# Patient Record
Sex: Male | Born: 1960 | Marital: Married | Smoking: Never smoker
Health system: Southern US, Community
[De-identification: ages and names within clinical notes are randomized; demographics above are authoritative.]

## PROBLEM LIST (undated history)

## (undated) DIAGNOSIS — N400 Enlarged prostate without lower urinary tract symptoms: Secondary | ICD-10-CM

## (undated) DIAGNOSIS — N529 Male erectile dysfunction, unspecified: Secondary | ICD-10-CM

## (undated) DIAGNOSIS — Z8619 Personal history of other infectious and parasitic diseases: Secondary | ICD-10-CM

## (undated) HISTORY — PX: HERNIA REPAIR: SHX51

## (undated) HISTORY — DX: Male erectile dysfunction, unspecified: N52.9

## (undated) HISTORY — DX: Benign prostatic hyperplasia without lower urinary tract symptoms: N40.0

## (undated) HISTORY — DX: Personal history of other infectious and parasitic diseases: Z86.19

---

## 2015-05-26 ENCOUNTER — Encounter: Payer: Self-pay | Admitting: Family Medicine

## 2015-05-26 ENCOUNTER — Ambulatory Visit (INDEPENDENT_AMBULATORY_CARE_PROVIDER_SITE_OTHER): Payer: Managed Care, Other (non HMO) | Admitting: Family Medicine

## 2015-05-26 VITALS — BP 147/89 | HR 78 | Temp 98.0°F | Resp 20 | Ht 74.0 in | Wt 175.8 lb

## 2015-05-26 DIAGNOSIS — R0781 Pleurodynia: Secondary | ICD-10-CM | POA: Insufficient documentation

## 2015-05-26 DIAGNOSIS — Z7689 Persons encountering health services in other specified circumstances: Secondary | ICD-10-CM

## 2015-05-26 DIAGNOSIS — R0789 Other chest pain: Secondary | ICD-10-CM | POA: Insufficient documentation

## 2015-05-26 DIAGNOSIS — Z7189 Other specified counseling: Secondary | ICD-10-CM | POA: Diagnosis not present

## 2015-05-26 MED ORDER — BACLOFEN 10 MG PO TABS: 10.0000 mg | ORAL_TABLET | Freq: Three times a day (TID) | ORAL | Status: AC

## 2015-05-26 NOTE — Progress Notes (Signed)
Patient ID: Peter Clay Clay, male   DOB: [birth year], 55 y.o.   MRN: [id number]      Patient ID: Peter Clay Clay, male  DOB: [birth year], 55 y.o.   MRN: [id number]  Subjective:  Peter Clay Clay is a 55 y.o. male present for establishment of care and back pain. All past medical history, surgical history, allergies, family history, immunizations, medications and social history were obtained and entered  in the electronic medical record today. All recent labs, ED visits and hospitalizations within the last year were reviewed.  His full-time home is in [place]. He works in the area through the week. He plans to continue his health maintenance and yearly physicals through his primary care physician in [place]. He plans on using this office for acute illnesses only while in the area. Records requested  Back pain: Patient states he went golfing approximately 10 days ago with his friend and by, and will Morning with a spasm down the left side of his mid back. He points just below his left scapula as location. He states the pain is worse when he lays on his back or his right side. He was evaluated by the doctor on his work campus, the prescribed him Flexeril 5 mg and tramadol for pain. Patient states he was unable to take these secondary to sedation and dizziness. Patient states he has never had a back injury prior. He denies any other increase in activity, with the exception of golfing. He has never had any back surgeries. He denies any malignancies, fever, chills, night sweats, unintentional weight loss, dyspnea.   Health maintenance: All health maintenance will be addressed by [place] PCP  Past Medical History  Diagnosis Date  . History of chicken pox    No Known Allergies Past Surgical History  Procedure Laterality Date  . Hernia repair      inguinal (right)   Family History  Problem Relation Age of Onset  . Alcohol abuse Father   . Early death Father   . Mental illness Sister   . Arthritis  Maternal Grandmother   . Hearing loss Maternal Grandmother   . Hyperlipidemia Maternal Grandmother   . Heart disease Maternal Grandmother   . Hearing loss Maternal Grandfather   . Hyperlipidemia Maternal Grandfather   . Heart disease Maternal Grandfather   . Arthritis Paternal Grandmother   . Hearing loss Paternal Grandfather   . Alcohol abuse Paternal Grandfather    Social History   Social History  . Marital Status: Married    Spouse Name: N/A  . Number of Children: N/A  . Years of Education: N/A   Occupational History  . Not on file.   Social History Main Topics  . Smoking status: Never Smoker   . Smokeless tobacco: Never Used  . Alcohol Use: 1.2 - 1.8 oz/week    2-3 Cans of beer per week  . Drug Use: No  . Sexual Activity: Yes   Other Topics Concern  . Not on file   Social History Narrative   Married to Peter Clay Clay. One child name Peter Clay Clay.   Full-time home was in [place], he works in the area through the week. He plans to continue his routine annual exams through his doctor in [place].   MBA, full-time employed Theatre manager.   Drinks caffeine, does not use herbal remedies.   Wear seatbelt, wears bicycle helmet, exercise is greater than 3 times a week   Smoke detector at home, firearms in the home and in a locked cabinet.  Safe in his relationships.     ROS: Negative, with the exception of above mentioned in HPI  Objective: BP 147/89 mmHg  Pulse 78  Temp(Src) 98 F (36.7 C) (Oral)  Resp 20  Ht  (1.88 m)  Wt 175 lb 12 oz (79.72 kg)  BMI 22.56 kg/m2  SpO2 97% Gen: Afebrile. No acute distress. Nontoxic in appearance, well-developed, well-nourished, physically fit, Caucasian male. Pleasant HENT: AT. [place]. MMM, no oral lesions,  no Cough on exam, no hoarseness on exam. Eyes:Pupils Equal Round Reactive to light, Extraocular movements intact,  Conjunctiva without redness, discharge or icterus. Neck/lymp/endocrine: Supple, no lymphadenopathy CV:  RRR, no murmur, no edema Chest: CTAB, no wheeze, rhonchi or crackles. Normal Respiratory effort. Good Air movement. Abd: Soft. NTND. BS present.  Skin: No rashes, purpura or petechiae. No erythema, no bruising, skin intact. Neuro: Normal gait. PERLA. EOMi. Alert. Oriented x3.  MSK: Mild soft tissue swelling over ninth through 11th left lateral rib shaft, just inferior to scapula. Mild tenderness to palpation over this area. Full range of motion. Discomfort with side bending and rotation. Neurovascularly intact distally. Psych: Normal affect, dress and demeanor. Normal speech. Normal thought content and judgment.   Assessment/plan: [name] is a 55 y.o. male  present for establishment of care and  left lateral posterior rib pain. 1. Rib pain on left side - Discussed with patient possible differential diagnosis of rib fracture versus musculoskeletal strain. Patient has been unable to tolerate Flexeril and tramadol. Discussed baclofen use for less sedating properties, and Aleve twice a day for the next 5-7 days. Heat therapy, 20 minute intervals. - Patient to follow-up in one week, if he does not see improvement with the above regimen, he is to obtain future ordered x-ray the day prior to his appointment. - No lifting - baclofen (LIORESAL) 10 MG tablet; Take 1 tablet (10 mg total) by mouth 3 (three) times daily.  Dispense: 60 each; Refill: 0 - DG Ribs Unilateral Left; Future  Greater than 45 minutes was spent with patient, greater than 50% of that time was spent face-to-face with patient counseling and coordinating care.  No Follow-up on file.  Felix Pacini, DO  Primary [facility]- [place]

## 2015-05-26 NOTE — Patient Instructions (Signed)

## 2015-05-28 ENCOUNTER — Encounter: Payer: Self-pay | Admitting: Family Medicine

## 2015-05-28 DIAGNOSIS — Z7689 Persons encountering health services in other specified circumstances: Secondary | ICD-10-CM | POA: Insufficient documentation

## 2015-06-01 ENCOUNTER — Ambulatory Visit (HOSPITAL_BASED_OUTPATIENT_CLINIC_OR_DEPARTMENT_OTHER)
Admission: RE | Admit: 2015-06-01 | Discharge: 2015-06-01 | Disposition: A | Discharge status: Home / Self Care | Payer: Managed Care, Other (non HMO) | Source: Ambulatory Visit | Attending: Family Medicine | Admitting: Family Medicine

## 2015-06-01 ENCOUNTER — Encounter: Payer: Self-pay | Admitting: Family Medicine

## 2015-06-01 DIAGNOSIS — R0781 Pleurodynia: Secondary | ICD-10-CM | POA: Insufficient documentation

## 2016-05-07 IMAGING — CR DG RIBS 2V*L*
2 series · 2 of 2 positions shown · non-contrast
Comparison: None.

CLINICAL DATA: LEFT lower rib pain for 10 days.

EXAM:
LEFT RIBS - 2 VIEW

[w ribs ap/pa upper left]
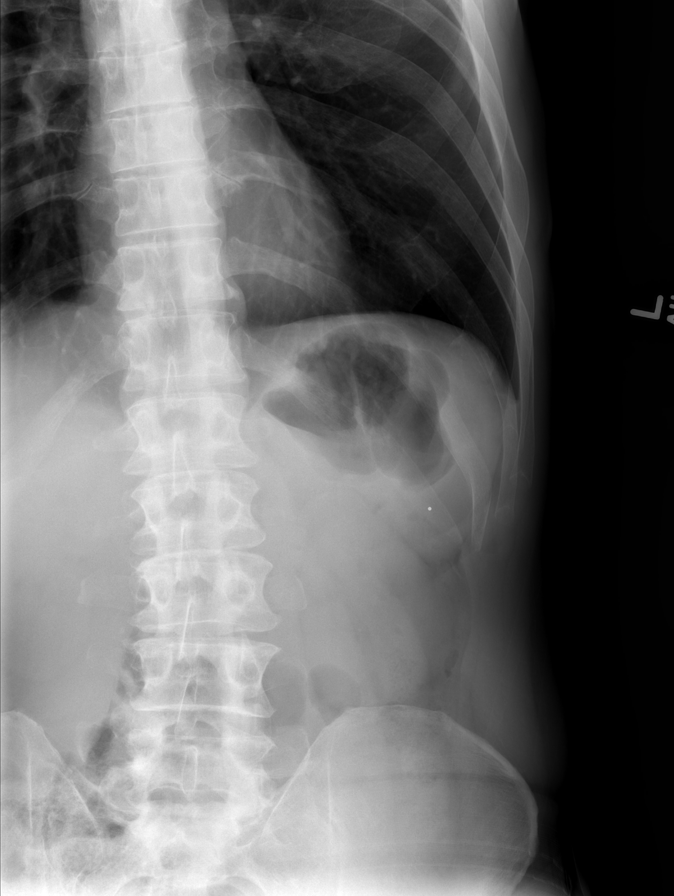

[w ribs oblique left]
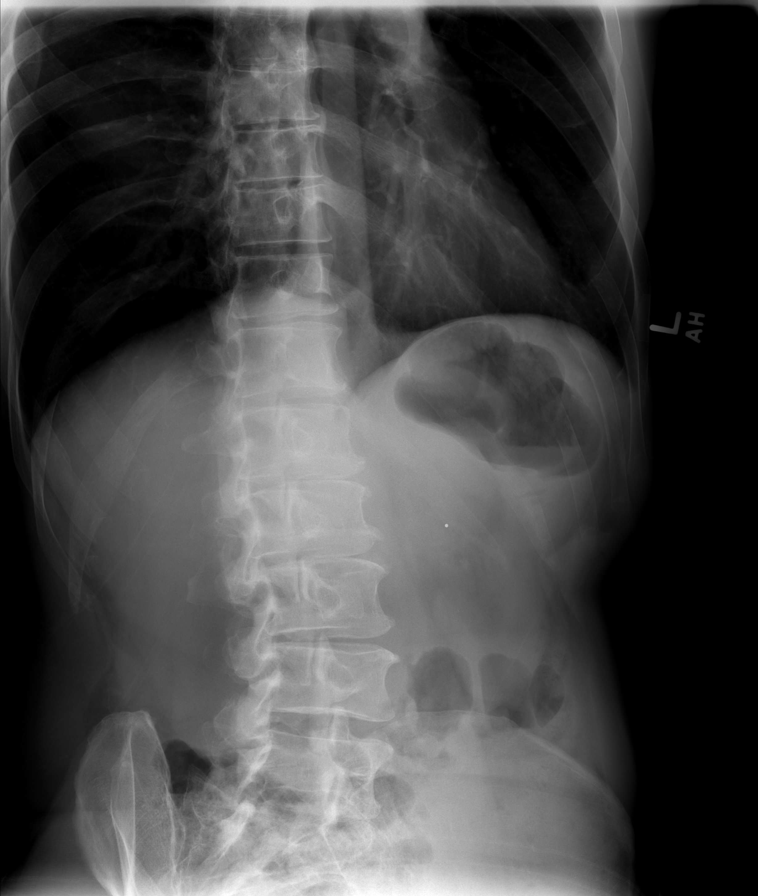

[2 of 2 positions shown; findings below may reference images not displayed]

FINDINGS: No displaced rib fractures on the LEFT. No pneumothorax. No pleural
fluid.
IMPRESSION: No LEFT rib fractures.
